# Patient Record
Sex: Female | Born: 1995 | Race: White | Hispanic: No | Marital: Single | Smoking: Never smoker
Health system: Southern US, Community
[De-identification: ages and names within clinical notes are randomized; demographics above are authoritative.]

## PROBLEM LIST (undated history)

## (undated) DIAGNOSIS — F32A Depression, unspecified: Secondary | ICD-10-CM

## (undated) DIAGNOSIS — F329 Major depressive disorder, single episode, unspecified: Secondary | ICD-10-CM

---
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
## 2017-02-23 ENCOUNTER — Encounter: Payer: Self-pay | Admitting: Sports Medicine

## 2017-02-23 MED ORDER — IBUPROFEN-FAMOTIDINE 800-26.6 MG PO TABS: 1.0000 | ORAL_TABLET | Freq: Three times a day (TID) | ORAL | 2 refills | Status: DC | PRN

## 2017-02-23 NOTE — Progress Notes (Signed)
  [facility] Training Room Note Veverly FellsMichael D. Delorise Shinerigby, DO  [facility] [place] [phone]  Catherine Bradshaw - 21 y.o. female MRN [id number]  Date of birth: [birth year]  Visit Date: 02/23/2017  PCP: No primary care provider on file.   Referred by: No ref. provider found  SUBJECTIVE:  CC: Right Anterior Hip Pain HPI: 4 weeks of worsening right anterior hip pain that has been fairly stable over the past 4 weeks.  She has been taking intermittent ibuprofen without significant improvement.  Pain is worse with any type of hip flexion.  She has been performing some gentle exercises.  No known trauma.  ROS:   Otherwise per HPI.  OBJECTIVE:  Right hip is overall well aligned.  She has no significant swelling or deformity.  She has worse pain with Pearlean BrownieFaber testing localizing to the right anterior groin.  Minimal pain with Stinchfield testing.  No pain with logroll or FADIR testing.  No significant pain or crepitation or catching with axial load and circumduction.    IMAGING & PROCEDURES: Three-[facility] reveals overall normal bony anatomy.  She is a small amount of osteitis pubis that is incidental.  She has overall normal hip morphology with possibly a small pincer deformity but this is minimal.  Overall well-maintained joint spaces.  No osseous irregularities.   ASSESSMENT & PLAN:  Visit Diagnoses: 1.  Right hip pain consistent with likely right hip flexor strain versus labral pathology.  Given the presentation we will have her begin with 3 weeks of Duexis.  Prescription and sample provided.  We will plan to have her continue with this and see how she progresses with gentle stretching and continued relative rest.  If any lack of improvement I will plan to see her back after the holiday.  Can consider diagnostic and therapeutic intra-articular injection versus MR arthrogram      Meds: No orders of the  defined types were placed in this encounter.   Orders: No orders of the defined types were placed in this encounter.        Andrena MewsMichael D Kito Cuffe, DO    [facility] Physician

## 2017-03-01 ENCOUNTER — Telehealth: Payer: Self-pay | Admitting: Sports Medicine

## 2017-03-01 MED ORDER — IBUPROFEN-FAMOTIDINE 800-26.6 MG PO TABS: ORAL_TABLET | ORAL | 2 refills | Status: DC

## 2017-03-01 NOTE — Telephone Encounter (Signed)
Patient reported she has not received the prescription sent and at the last athletic training room encounter.  New prescription was sent today to be shipped to her new home address and she is going home over the holidays.

## 2017-04-21 ENCOUNTER — Encounter: Payer: Self-pay | Admitting: Sports Medicine

## 2017-04-21 NOTE — Progress Notes (Signed)
  [employer] Training Room Note Veverly FellsMichael D. Delorise Shinerigby, DO  [facility] [place] [phone]  Earney Malletnna Kimber - 22 y.o. female MRN [id number]  Date of birth: 09-21-1995  Visit Date: 04/21/2017  PCP: No primary care provider on file.   Referred by: No ref. provider found  SUBJECTIVE:  CC: Right hip pain f/u HPI: Persistent right anterior hip pain with snapping that is worse while going from flexed to an extended position.  Painful popping slightly but not all the time.  Pain is worse with significant cavity.  She has not been performing therapeutic exercises on a regular basis. ROS: No significant nighttime disturbances.  Otherwise per HPI.  OBJECTIVE:  Right hip is overall well aligned.  She does have pain while going from a hip flexion to neutral position with slight internal rotation. Reproducible snapping over the anterior groin.  She has a small amount of focal pain with palpation of the anterior musculature but this is minimal.  She has generally tight hip flexors but overall hypermobile lumbar spine.  IMAGING & PROCEDURES: MSK ultrasound performed that shows overall no significant hip effusion and the visible labrum does appear to be intact.  She has a fibrotic and markedly tight iliopsoas tendon over the anterior hip.  I am unable to cause the reproducible crepitation while performing ultrasound but she does have dysfunctional muscle contraction with flexion and extension with dynamic impingement appreciated of the iliopsoas tendon.  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  We will plan to have her come to the clinic to have a iliopsoas tendon fenestration and corticosteroid injection plus intra-articular injection next week.  Discussed the risks and benefits briefly and if any lack of  improvement would consider further diagnostic MR arthrogram of the right hip if any lack of improvement with diagnostic and therapeutic injection as outlined above.       Andrena MewsMichael D Rigby, DO     Sports Medicine Physician

## 2017-04-26 ENCOUNTER — Ambulatory Visit: Payer: Self-pay

## 2017-04-26 ENCOUNTER — Ambulatory Visit (INDEPENDENT_AMBULATORY_CARE_PROVIDER_SITE_OTHER): Payer: Commercial Managed Care - PPO | Admitting: Sports Medicine

## 2017-04-26 ENCOUNTER — Encounter: Payer: Self-pay | Admitting: Sports Medicine

## 2017-04-26 VITALS — BP 114/76 | HR 59 | Ht 74.0 in | Wt 155.4 lb

## 2017-04-26 DIAGNOSIS — M25551 Pain in right hip: Secondary | ICD-10-CM

## 2017-04-26 NOTE — Progress Notes (Signed)
Catherine Bradshaw. [facility] at [facility] [phone]  Narda Fundora - 22 y.o. female MRN [id number]  Date of birth: 1995-12-17  Visit Date: 04/26/2017  PCP: No primary care provider on file.   Referred by: No ref. provider found   Scribe for today's visit: Christoper Fabian, LAT, ATC     SUBJECTIVE:  Catherine Bradshaw is here for Follow-up (R hip pain) .   Her R hip pain symptoms INITIALLY: Began in Nov. 13, 2018  When she was sprinting during a punishment  Described as moderate aching and stabbing, radiating to R ant thigh. 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   ROS Reports night time disturbances.  Intermittent depending on what she did during the day. Denies fevers, chills, or night sweats. Denies unexplained weight loss. Denies personal history of cancer. Denies changes in bowel or bladder habits. Denies recent unreported falls. Denies new or worsening dyspnea or wheezing. Denies headaches or dizziness.  Denies numbness, tingling or weakness  In the extremities.  Denies dizziness or presyncopal episodes Denies lower extremity edema     HISTORY & PERTINENT PRIOR DATA:  Prior History reviewed and updated per electronic medical record.  Significant history, findings, studies and interim changes include:  reports that  has never smoked. she has never used smokeless tobacco. No results for input(s): HGBA1C, LABURIC, CREATINE in the last 8760 hours. No specialty comments available. Problem  Right Hip Pain    OBJECTIVE:  VS:  HT:6\' 2"  (188 cm)   WT:155 lb 6.4 oz (70.5 kg)  BMI:19.94    BP:114/76  HR:(!) 59bpm  TEMP: ( )   RESP:99 %   PHYSICAL EXAM: Constitutional: WDWN, Non-toxic appearing. Psychiatric: Alert & appropriately interactive.  Not depressed or anxious appearing. Respiratory: No increased work of breathing.  Trachea Midline Eyes: Pupils are equal.  EOM intact without nystagmus.  No scleral icterus  NEUROVASCULAR exam: No clubbing or cyanosis appreciated No significant venous stasis changes Capillary Refill: normal, less than 2 seconds   Right hip is overall well aligned.  She has some pain with flexion of the hip but this is minimal.  She has good internal and external rotation of the hip.  She does have a reproducible snapping with internal and external rotation.  No additional findings.   ASSESSMENT & PLAN:   1. Right hip pain    PLAN: Discussed the options for injection and ultimately decided to perform both intratendinous and intra-articular components per procedure note.  She should avoid significant exacerbating activities and I will plan to follow-up with her in 3 weeks in the athletic training room.  No problem-specific Assessment & Plan notes found for this encounter.   ++++++++++++++++++++++++++++++++++++++++++++ Orders & Meds: Orders Placed This Encounter  Procedures  . [place] MSK POCT ULTRASOUND    No orders of the defined types were placed in this encounter.   ++++++++++++++++++++++++++++++++++++++++++++ Follow-up: Return in about 3 weeks (around 05/17/2017) for in the [facility].   Pertinent documentation may be included in additional procedure notes, imaging studies, problem based documentation and patient instructions. Please see these sections of the encounter for additional information regarding this visit. CMA/ATC served as Neurosurgeon during this  visit. History, Physical, and Plan performed by medical provider. Documentation and orders reviewed and attested to.      Andrena MewsMichael D Rigby, DO    [place] Physician

## 2017-04-26 NOTE — Procedures (Signed)
PROCEDURE NOTE:  Ultrasound Guided: Injection: Right iliopsoas intratendinous and right hip intra-articular injection Images were obtained and interpreted by myself, Gaspar BiddingMichael Rigby, DO  Images have been saved and stored to PACS system. Images obtained on: GE S7 Ultrasound machine  ULTRASOUND FINDINGS:  Marked fibrotic change of the iliopsoas tendon as it transverses over the anterior hip.  There is no significant joint effusion and the anterior labrum that is able to be visualized is overall well-appearing without evidence of tearing but this is obviously limited test.  DESCRIPTION OF PROCEDURE:  The patient's clinical condition is marked by substantial pain and/or significant functional disability. Other conservative therapy has not provided relief, is contraindicated, or not appropriate. There is a reasonable likelihood that injection will significantly improve the patient's pain and/or functional impairment.  After discussing the risks, benefits and expected outcomes of the injection and all questions were reviewed and answered, the patient wished to undergo the above named procedure. Verbal consent was obtained.  The ultrasound was used to identify the target structure and adjacent neurovascular structures. The skin was then prepped in sterile fashion and the target structure was injected under direct visualization using sterile technique as below:  PREP: Alcohol, Ethel Chloride,   APPROACH: direct, stopcock technique, 22g 3.5in. INJECTATE: 5 cc 1% lidocaine, 2cc 0.5% marcaine, 2cc 40mg /mL DepoMedrol    The iliopsoas tendinous portion was injected initially under direct guidance in both longitudinal and transverse planes to ensure appropriate placement.  Subsequently the needle was repositioned and intra-articular injection was performed with equal distribution of the corticosteroid component into both the tendinous portion and to the intra-articular portion. ASPIRATE: None   DRESSING:  Band-Aid    Post procedural instructions including recommending icing and warning signs for infection were reviewed.  This procedure was well tolerated and there were no complications.   IMPRESSION: Succesful Ultrasound Guided: Injection

## 2017-04-26 NOTE — Patient Instructions (Addendum)
You had an injection today.  Things to be aware of after injection are listed below: . You may experience no significant improvement or even a slight worsening in your symptoms during the first 24 to 48 hours.  After that we expect your symptoms to improve gradually over the next 2 weeks for the medicine to have its maximal effect.  You should continue to have improvement out to 6 weeks after your injection. . Dr. Mariella Blackwelder recommends icing the site of the injection for 20 minutes  1-2 times the day of your injection . You may shower but no swimming, tub bath or Jacuzzi for 24 hours. . If your bandage falls off this does not need to be replaced.  It is appropriate to remove the bandage after 4 hours. . You may resume light activities as tolerated unless otherwise directed per Dr. Sharyn Brilliant during your visit  POSSIBLE STEROID SIDE EFFECTS:  Side effects from injectable steroids tend to be less than when taken orally however you may experience some of the symptoms listed below.  If experienced these should only last for a short period of time. Change in menstrual flow  Edema (swelling)  Increased appetite Skin flushing (redness)  Skin rash/acne  Thrush (oral) Yeast vaginitis    Increased sweating  Depression Increased blood glucose levels Cramping and leg/calf  Euphoria (feeling happy)  POSSIBLE PROCEDURE SIDE EFFECTS: The side effects of the injection are usually fairly minimal however if you may experience some of the following side effects that are usually self-limited and will is off on their own.  If you are concerned please feel free to call the office with questions:  Increased numbness or tingling  Nausea or vomiting  Swelling or bruising at the injection site   Please call our office if if you experience any of the following symptoms over the next week as these can be signs of infection:   Fever greater than 100.5F  Significant swelling at the injection site  Significant redness or drainage  from the injection site  If after 2 weeks you are continuing to have worsening symptoms please call our office to discuss what the next appropriate actions should be including the potential for a return office visit or other diagnostic testing.   You had an injection today.  Things to be aware of after injection are listed below: . You may experience no significant improvement or even a slight worsening in your symptoms during the first 24 to 48 hours.  After that we expect your symptoms to improve gradually over the next 2 weeks for the medicine to have its maximal effect.  You should continue to have improvement out to 6 weeks after your injection. . Dr. Kalley Nicholl recommends icing the site of the injection for 20 minutes  1-2 times the day of your injection . You may shower but no swimming, tub bath or Jacuzzi for 24 hours. . If your bandage falls off this does not need to be replaced.  It is appropriate to remove the bandage after 4 hours. . You may resume light activities as tolerated unless otherwise directed per Dr. Diarra Kos during your visit  POSSIBLE STEROID SIDE EFFECTS:  Side effects from injectable steroids tend to be less than when taken orally however you may experience some of the symptoms listed below.  If experienced these should only last for a short period of time. Change in menstrual flow  Edema (swelling)  Increased appetite Skin flushing (redness)  Skin rash/acne  Thrush (oral)   Yeast vaginitis    Increased sweating  Depression Increased blood glucose levels Cramping and leg/calf  Euphoria (feeling happy)  POSSIBLE PROCEDURE SIDE EFFECTS: The side effects of the injection are usually fairly minimal however if you may experience some of the following side effects that are usually self-limited and will is off on their own.  If you are concerned please feel free to call the office with questions:  Increased numbness or tingling  Nausea or vomiting  Swelling or bruising at the  injection site   Please call our office if if you experience any of the following symptoms over the next week as these can be signs of infection:   Fever greater than 100.5F  Significant swelling at the injection site  Significant redness or drainage from the injection site  If after 2 weeks you are continuing to have worsening symptoms please call our office to discuss what the next appropriate actions should be including the potential for a return office visit or other diagnostic testing.    

## 2017-05-11 ENCOUNTER — Emergency Department (HOSPITAL_COMMUNITY)
Admission: EM | Admit: 2017-05-11 | Discharge: 2017-05-12 | Disposition: A | Discharge status: Home / Self Care | Payer: Commercial Managed Care - PPO | Attending: Emergency Medicine | Admitting: Emergency Medicine

## 2017-05-11 ENCOUNTER — Other Ambulatory Visit: Payer: Self-pay

## 2017-05-11 ENCOUNTER — Encounter (HOSPITAL_COMMUNITY): Payer: Self-pay | Admitting: Nurse Practitioner

## 2017-05-11 DIAGNOSIS — F330 Major depressive disorder, recurrent, mild: Secondary | ICD-10-CM | POA: Diagnosis present

## 2017-05-11 DIAGNOSIS — Z79899 Other long term (current) drug therapy: Secondary | ICD-10-CM | POA: Insufficient documentation

## 2017-05-11 DIAGNOSIS — R45851 Suicidal ideations: Secondary | ICD-10-CM | POA: Diagnosis not present

## 2017-05-11 DIAGNOSIS — F329 Major depressive disorder, single episode, unspecified: Secondary | ICD-10-CM | POA: Diagnosis present

## 2017-05-11 HISTORY — DX: Major depressive disorder, single episode, unspecified: F32.9

## 2017-05-11 HISTORY — DX: Depression, unspecified: F32.A

## 2017-05-11 LAB — RAPID URINE DRUG SCREEN, HOSP PERFORMED
AMPHETAMINES: NOT DETECTED
BARBITURATES: NOT DETECTED
BENZODIAZEPINES: NOT DETECTED
Cocaine: NOT DETECTED
Opiates: NOT DETECTED
TETRAHYDROCANNABINOL: NOT DETECTED

## 2017-05-11 LAB — CBC WITH DIFFERENTIAL/PLATELET
BASOS ABS: 0 10*3/uL (ref 0.0–0.1)
BASOS PCT: 0 %
EOS ABS: 0.3 10*3/uL (ref 0.0–0.7)
EOS PCT: 4 %
HCT: 37.9 % (ref 36.0–46.0)
Hemoglobin: 12.8 g/dL (ref 12.0–15.0)
Lymphocytes Relative: 35 %
Lymphs Abs: 2.2 10*3/uL (ref 0.7–4.0)
MCH: 30.2 pg (ref 26.0–34.0)
MCHC: 33.8 g/dL (ref 30.0–36.0)
MCV: 89.4 fL (ref 78.0–100.0)
Monocytes Absolute: 0.4 10*3/uL (ref 0.1–1.0)
Monocytes Relative: 7 %
Neutro Abs: 3.3 10*3/uL (ref 1.7–7.7)
Neutrophils Relative %: 54 %
PLATELETS: 189 10*3/uL (ref 150–400)
RBC: 4.24 MIL/uL (ref 3.87–5.11)
RDW: 12.8 % (ref 11.5–15.5)
WBC: 6.1 10*3/uL (ref 4.0–10.5)

## 2017-05-11 LAB — URINALYSIS, ROUTINE W REFLEX MICROSCOPIC
BACTERIA UA: NONE SEEN
BILIRUBIN URINE: NEGATIVE
Glucose, UA: NEGATIVE mg/dL
Ketones, ur: NEGATIVE mg/dL
Nitrite: NEGATIVE
PROTEIN: NEGATIVE mg/dL
SPECIFIC GRAVITY, URINE: 1.026 (ref 1.005–1.030)
pH: 5 (ref 5.0–8.0)

## 2017-05-11 LAB — ETHANOL: Alcohol, Ethyl (B): <10 mg/dL (ref <10)

## 2017-05-11 LAB — COMPREHENSIVE METABOLIC PANEL
ALBUMIN: 3.9 g/dL (ref 3.5–5.0)
ALT: 14 U/L (ref 14–54)
AST: 15 U/L (ref 15–41)
Alkaline Phosphatase: 47 U/L (ref 38–126)
Anion gap: 8 (ref 5–15)
BILIRUBIN TOTAL: 0.5 mg/dL (ref 0.3–1.2)
BUN: 10 mg/dL (ref 6–20)
CO2: 24 mmol/L (ref 22–32)
Calcium: 9 mg/dL (ref 8.9–10.3)
Chloride: 106 mmol/L (ref 101–111)
Creatinine, Ser: 0.87 mg/dL (ref 0.44–1.00)
GFR calc Af Amer: >60 mL/min (ref >60)
GFR calc non Af Amer: >60 mL/min (ref >60)
Glucose, Bld: 102 mg/dL — ABNORMAL HIGH (ref 65–99)
POTASSIUM: 3.9 mmol/L (ref 3.5–5.1)
Sodium: 138 mmol/L (ref 135–145)
TOTAL PROTEIN: 7.2 g/dL (ref 6.5–8.1)

## 2017-05-11 LAB — I-STAT BETA HCG BLOOD, ED (MC, WL, AP ONLY)

## 2017-05-11 LAB — SALICYLATE LEVEL

## 2017-05-11 LAB — ACETAMINOPHEN LEVEL

## 2017-05-11 MED ORDER — IBUPROFEN 200 MG PO TABS: 600.0000 mg | ORAL_TABLET | Freq: Three times a day (TID) | ORAL | Status: DC | PRN

## 2017-05-11 MED ORDER — ONDANSETRON HCL 4 MG PO TABS: 4.0000 mg | ORAL_TABLET | Freq: Three times a day (TID) | ORAL | Status: DC | PRN

## 2017-05-11 MED ORDER — ESCITALOPRAM OXALATE 10 MG PO TABS
10.0000 mg | ORAL_TABLET | Freq: Every day | ORAL | Status: DC
  Administered 2017-05-12: 10 mg via ORAL
  Filled 2017-05-11: qty 1

## 2017-05-11 MED ORDER — NORETHIN ACE-ETH ESTRAD-FE 1.5-30 MG-MCG PO TABS: 1.0000 | ORAL_TABLET | Freq: Every day | ORAL | Status: DC

## 2017-05-11 NOTE — ED Notes (Signed)
Pt ambulatory w/o difficulty to room 34 

## 2017-05-11 NOTE — ED Triage Notes (Signed)
[facility]

## 2017-05-11 NOTE — ED Notes (Signed)
On the phone 

## 2017-05-11 NOTE — ED Notes (Signed)
Report given to [facility] RN 

## 2017-05-11 NOTE — ED Notes (Signed)
Pt A&O x 3, no distress noted, calm & cooperative, Visiting with family at present.  Monitoring for safety, Q 15 min checks in effect.

## 2017-05-11 NOTE — ED Provider Notes (Signed)
[facility]-EMERGENCY DEPT Provider Note   CSN: [id number] Arrival date & time: 05/11/17  1403     History   Chief Complaint No chief complaint on file.   HPI Catherine Bradshaw is a 22 y.o. female.  Pt presents to the ED today with depression and si.  The pt said she's been dealing with depression and last night, she had thoughts of taking all her lexapro to kill herself.  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  She has never tried to actually hurt herself.      Past Medical History:  Diagnosis Date  . Depression     Patient Active Problem List   Diagnosis Date Noted  . Right hip pain 04/26/2017  . Chronic left shoulder pain 10/19/2016    History reviewed. No pertinent surgical history.  OB History    No data available       Home Medications    Prior to Admission medications   Medication Sig Start Date End Date Taking? Authorizing Provider  escitalopram (LEXAPRO) 10 MG tablet Take 10 mg by mouth daily.   Yes [provider]  norethindrone-ethinyl estradiol-iron (MICROGESTIN FE,GILDESS FE,LOESTRIN FE) 1.5-30 MG-MCG tablet Take 1 tablet by mouth daily.   Yes [provider]  Ibuprofen-Famotidine (DUEXIS) 800-26.6 MG TABS 1 tab po tid X 14 days then 1 tab po tid as needed Patient not taking: Reported on 05/11/2017 03/01/17   Andrena Mews, DO    Family History History reviewed. No pertinent family history.  Social History Social History   Tobacco Use  . Smoking status: Never Smoker  . Smokeless tobacco: Never Used  Substance Use Topics  . Alcohol use: No    Frequency: Never  . Drug use: No     Allergies   Patient has no known allergies.   Review of Systems Review of Systems  Psychiatric/Behavioral: Positive for suicidal ideas.  All other systems reviewed and are negative.    Physical Exam Updated Vital Signs BP 119/68 (BP Location: Left Arm)   Pulse 70   Temp 98.2 F (36.8 C) (Oral)   Resp  16   Ht 6\' 2"  (1.88 m)   Wt 70.3 kg (155 lb)   LMP 05/11/2017   SpO2 99%   BMI 19.90 kg/m   Physical Exam  Constitutional: She is oriented to person, place, and time. She appears well-developed and well-nourished.  HENT:  Head: Normocephalic and atraumatic.  Right Ear: External ear normal.  Left Ear: External ear normal.  Nose: Nose normal.  Mouth/Throat: Oropharynx is clear and moist.  Eyes: Conjunctivae and EOM are normal. Pupils are equal, round, and reactive to light.  Neck: Normal range of motion. Neck supple.  Cardiovascular: Normal rate, regular rhythm, normal heart sounds and intact distal pulses.  Pulmonary/Chest: Effort normal and breath sounds normal.  Abdominal: Soft. Bowel sounds are normal.  Musculoskeletal: Normal range of motion.  Neurological: She is alert and oriented to person, place, and time.  Skin: Skin is warm. Capillary refill takes less than 2 seconds.  Psychiatric: She exhibits a depressed mood. She expresses suicidal ideation.  Nursing note and vitals reviewed.    ED Treatments / Results  Labs (all labs ordered are listed, but only abnormal results are displayed) Labs Reviewed  COMPREHENSIVE METABOLIC PANEL - Abnormal; Notable for the following components:      Result Value   Glucose, Bld 102 (*)    All other components within normal limits  URINALYSIS,  ROUTINE W REFLEX MICROSCOPIC - Abnormal; Notable for the following components:   Hgb urine dipstick LARGE (*)    Leukocytes, UA TRACE (*)    Squamous Epithelial / LPF 0-5 (*)    All other components within normal limits  RAPID URINE DRUG SCREEN, HOSP PERFORMED  CBC WITH DIFFERENTIAL/PLATELET  ETHANOL  SALICYLATE LEVEL  ACETAMINOPHEN LEVEL  I-STAT BETA HCG BLOOD, ED ([facility], [facility], [facility] ONLY)    EKG  EKG Interpretation None       Radiology No results found.  Procedures Procedures (including critical care time)  Medications Ordered in ED Medications  ibuprofen (ADVIL,MOTRIN) tablet 600  mg (not administered)  ondansetron (ZOFRAN) tablet 4 mg (not administered)     Initial Impression / Assessment and Plan / ED Course  I have reviewed the triage vital signs and the nursing notes.  Pertinent labs & imaging results that were available during my care of the patient were reviewed by me and considered in my medical decision making (see chart for details).  Labs are ok.  Pt is medically clear.   TTS consult placed.  Disposition per TTS.  Final Clinical Impressions(s) / ED Diagnoses   Final diagnoses:  Suicidal ideation  Major depressive disorder with single episode, remission status unspecified    ED Discharge Orders    None       Jacalyn LefevreHaviland, Philisha Weinel, MD 05/11/17 [phone]
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
## 2017-05-12 ENCOUNTER — Inpatient Hospital Stay (HOSPITAL_COMMUNITY)
Admission: AD | Admit: 2017-05-12 | Payer: Commercial Managed Care - PPO | Source: Intra-hospital | Admitting: Psychiatry

## 2017-05-12 DIAGNOSIS — F330 Major depressive disorder, recurrent, mild: Secondary | ICD-10-CM | POA: Diagnosis present

## 2017-05-12 NOTE — ED Notes (Signed)
Pt discharged to the care of her mother.  Resources were reviewed with patient.  All belongings were returned. Pt contracts for safety.

## 2017-05-12 NOTE — ED Notes (Signed)
As per TTS, pt [facility].

## 2017-05-12 NOTE — BHH Counselor (Signed)
Pt states she does not want to sign the voluntary admissions form because her mother is coming in the morning and taking her "somewhere".  The IVC process was explained to the pt.  Will revisit placement for the pt in the AM.  [facility] and pt RN Joanie CoddingtonLatricia were made aware.

## 2017-05-12 NOTE — BH Assessment (Signed)
[facility]  Per Juanetta BeetsJacqueline Norman, DO, this pt does not require psychiatric hospitalization at this time.  [facility] for both [facility] and counseling.  This has been included in pt's discharge instructions.  Pt's nurse, Kendal Hymendie, has been notified.  Doylene Canninghomas Chalice Philbert, MA Triage Specialist [phone]

## 2017-05-12 NOTE — BHH Counselor (Signed)
[facility] and can arrive ASAP [facility].

## 2017-05-12 NOTE — Discharge Instructions (Signed)
For your behavioral health needs, you are advised to [facility] with [facility].  They offer both psychiatry and counseling:       Counseling Center      [facility] Health Services      [facility] [place]      [facility]      [address]      [place], [place] [address]      [phone]

## 2017-05-12 NOTE — BHH Suicide Risk Assessment (Signed)
Suicide Risk Assessment  Discharge Assessment   [facility]   Principal Problem: Major depressive disorder, recurrent episode, mild (HCC) Discharge Diagnoses:  Patient Active Problem List   Diagnosis Date Noted  . Major depressive disorder, recurrent episode, mild (HCC) [F33.0] 05/12/2017    Priority: High  . Right hip pain [M25.551] 04/26/2017  . Chronic left shoulder pain [M25.512, G89.29] 10/19/2016    Total Time spent with patient: 45 minutes  Musculoskeletal: Strength & Muscle Tone: within normal limits Gait & Station: normal Patient leans: N/A  Psychiatric Specialty Exam:   Blood pressure 127/71, pulse 60, temperature 98 F (36.7 C), temperature source Oral, resp. rate 18, height 6\' 2"  (1.88 m), weight 70.3 kg (155 lb), last menstrual period 05/11/2017, SpO2 100 %.Body mass index is 19.9 kg/m.  General Appearance: Casual  Eye Contact::  Good  Speech:  Normal Rate409  Volume:  Normal  Mood:  Depressed, mild  Affect:  Congruent  Thought Process:  Coherent and Descriptions of Associations: Intact  Orientation:  Full (Time, Place, and Person)  Thought Content:  WDL and Logical  Suicidal Thoughts:  No  Homicidal Thoughts:  No  Memory:  Immediate;   Good Recent;   Good Remote;   Good  Judgement:  Good  Insight:  Good  Psychomotor Activity:  Normal  Concentration:  Good  Recall:  Good  Fund of Knowledge:Good  Language: Good  Akathisia:  No  Handed:  Right  AIMS (if indicated):     Assets:  Leisure Time Physical Health Resilience Social Support  Sleep:     Cognition: WNL  ADL's:  Intact   Mental Status Per Nursing Assessment::   On Admission:    22 yo female who presented to the ED with suicidal ideations a couple of days and wanted to have her medications adjusted.  She has been upset over an injury that has kept her from playing volleyball for her college.  Her [place] from [place] stay with her.  Neither want  her to be admitted and do not have safety concerns.  No past suicide attempts, currently takes Lexapro and sees her counseling center at school.  No suicidal/homicidal ideations, hallucinations, and substance abuse.  Stable for discharge.  Demographic Factors:  Adolescent or young adult and Caucasian  Loss Factors: NA  Historical Factors: NA  Risk Reduction Factors:   Sense of responsibility to family, Living with another person, especially a relative, Positive social support and Positive therapeutic relationship  Continued Clinical Symptoms:  Depression, mild  Cognitive Features That Contribute To Risk:  None    Suicide Risk:  Minimal: No identifiable suicidal ideation.  Patients presenting with no risk factors but with morbid ruminations; may be classified as minimal risk based on the severity of the depressive symptoms    Plan Of Care/Follow-up recommendations:  Activity:  as tolerated Diet:  heart healhty diet  Toniyah Dilmore, NP 05/12/2017, 10:27 AM

## 2017-05-19 ENCOUNTER — Telehealth: Payer: Self-pay | Admitting: Sports Medicine

## 2017-05-19 DIAGNOSIS — M25551 Pain in right hip: Secondary | ICD-10-CM

## 2017-05-23 ENCOUNTER — Other Ambulatory Visit: Payer: Self-pay | Admitting: Physical Therapy

## 2017-05-23 DIAGNOSIS — M25551 Pain in right hip: Principal | ICD-10-CM

## 2017-05-23 DIAGNOSIS — G8929 Other chronic pain: Secondary | ICD-10-CM

## 2017-05-23 NOTE — Telephone Encounter (Signed)
  [facility] Training Room Note Veverly FellsMichael D. Delorise Shinerigby, DO  [facility] [place] [phone]  Earney Malletnna Tetterton - 22 y.o. female MRN [id number]  Date of birth: 1995-05-27  Visit Date: 05/19/17  PCP: Patient, No Pcp Per   Referred by: No ref. provider found  SUBJECTIVE:  CC: Right Hip Pain HPI: Persistent right hip pain clicking and popping with almost every step at this time.  She reports that her pain has improved over the past 1 week she actually had worsening following last injection that was both a peritendinous and intra-articular injection. ROS: Recent hospitalization for psychiatric reasons.  Reporting doing well at this time.  On Lexapro.  Otherwise per HPI.  OBJECTIVE:  Adult female.  No acute distress.  Alert and appropriate.  Insight is moderate. Right hip has reproducible clicking and popping with circumduction and flexion extension of the hip.  This is mildly painful.  No pain with axial load and circumduction that is mild.  IMAGING & PROCEDURES: n/a   ASSESSMENT & PLAN:  Visit Diagnoses:  1. Right hip pain    Given persistent ongoing mechanical symptoms, pain and lack of improvement following conservative measures and intra-articular injection further diagnostic evaluation with MR arthrogram indicated.  This will be ordered and will plan to follow-up with her after this is obtained.  Meds: No orders of the defined types were placed in this encounter.   Orders: No orders of the defined types were placed in this encounter.        Andrena MewsMichael D Kiyo Heal, DO    [facility] Physician

## 2017-06-08 ENCOUNTER — Ambulatory Visit
Admission: RE | Admit: 2017-06-08 | Discharge: 2017-06-08 | Disposition: A | Discharge status: Home / Self Care | Payer: Commercial Managed Care - PPO | Source: Ambulatory Visit | Attending: Sports Medicine | Admitting: Sports Medicine

## 2017-06-08 DIAGNOSIS — M25551 Pain in right hip: Principal | ICD-10-CM

## 2017-06-08 DIAGNOSIS — G8929 Other chronic pain: Secondary | ICD-10-CM

## 2017-06-08 MED ORDER — IOPAMIDOL (ISOVUE-M 200) INJECTION 41%
15.0000 mL | Freq: Once | INTRAMUSCULAR | Status: AC
  Administered 2017-06-08: 15 mL via INTRA_ARTICULAR

## 2017-06-08 NOTE — Progress Notes (Signed)
Positive for labral tear.  [facility] to [facility] Caswell [facility]winStubbs at [facility] for Arthroscopy/repair consult

## 2017-06-09 ENCOUNTER — Other Ambulatory Visit: Payer: Self-pay | Admitting: Physical Therapy

## 2017-06-09 DIAGNOSIS — G8929 Other chronic pain: Secondary | ICD-10-CM

## 2017-06-09 DIAGNOSIS — M25551 Pain in right hip: Principal | ICD-10-CM

## 2018-07-13 IMAGING — MR MR HIP*R* W/CM
4 of 5 series · 21 of 40 positions shown · IV contrast (agent unspecified)
Comparison: None.

CLINICAL DATA: Right hip pain since an injury playing volleyball in
October 2017.

EXAM:
MRI OF THE RIGHT HIP WITH CONTRAST (MR Arthrogram)
TECHNIQUE: Multiplanar, multisequence MR imaging of the hip was performed
immediately following contrast injection into the hip joint under
fluoroscopic guidance. No intravenous contrast was administered.

[Series 3: T1 fat-sat · axial · 4.0mm · 0.47mm/px · z∈[-68,+28]mm · 8 of 21 slices shown (1 of 2)]
[im 1/21]
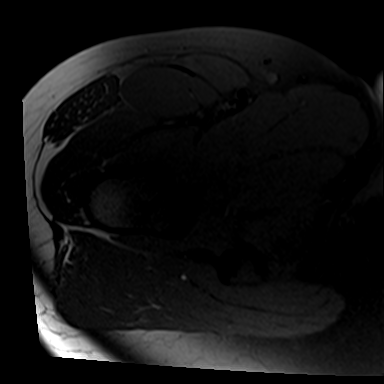
[im 3/21]
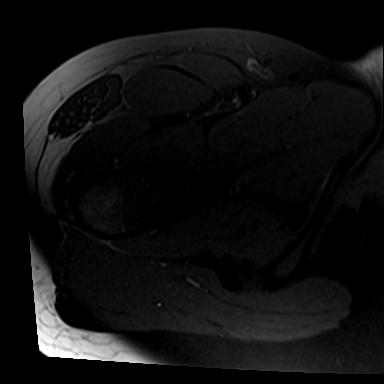
[im 6/21]
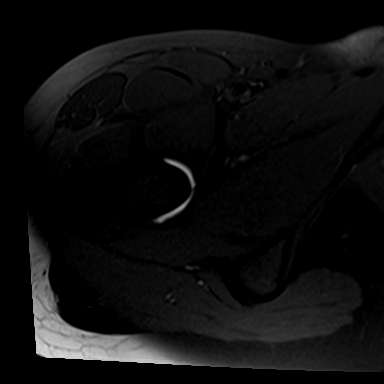
[im 9/21]
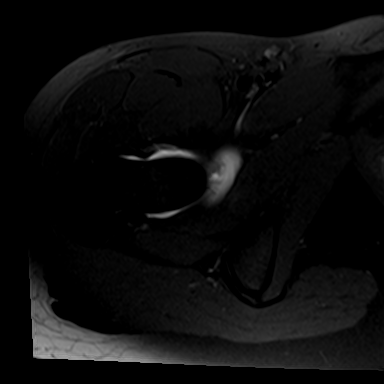
[im 12/21]
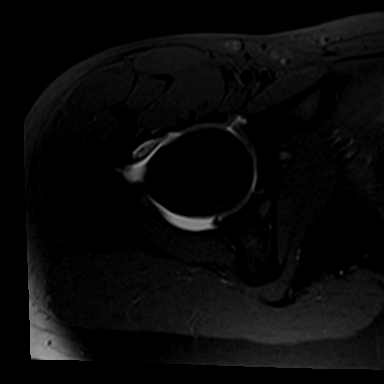
[im 15/21]
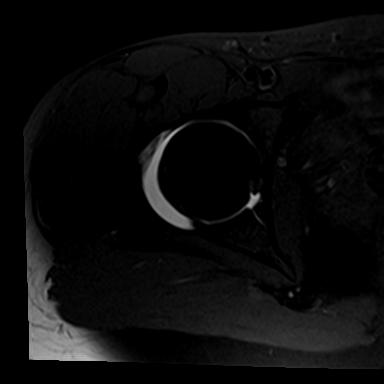
[im 18/21]
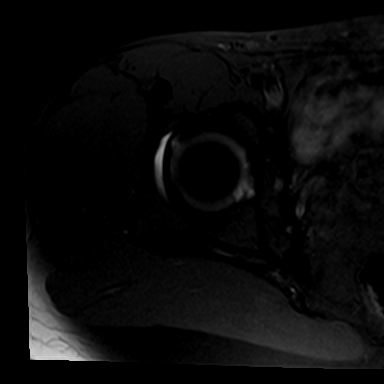
[im 21/21]
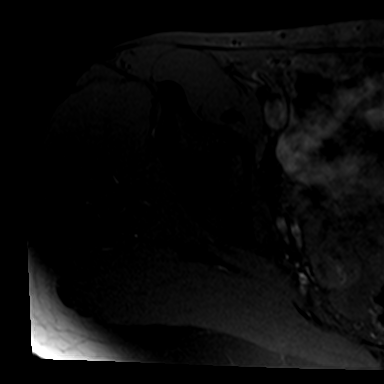

[Series 4: T1 fat-sat · coronal · 4.0mm · 0.47mm/px · 7 of 20 slices shown (2 of 2)]
[im 1/20]
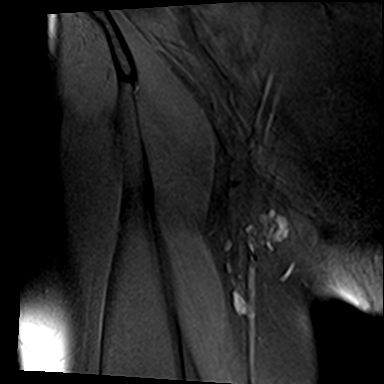
[im 4/20]
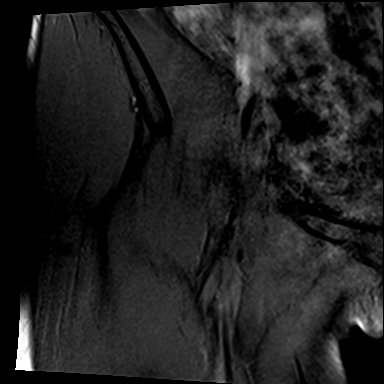
[im 7/20]
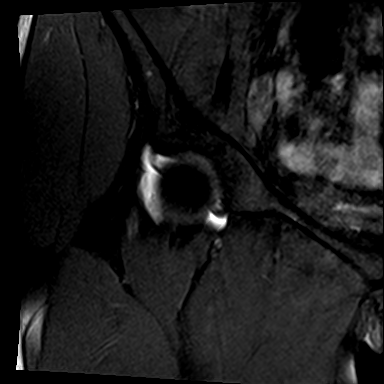
[im 10/20]
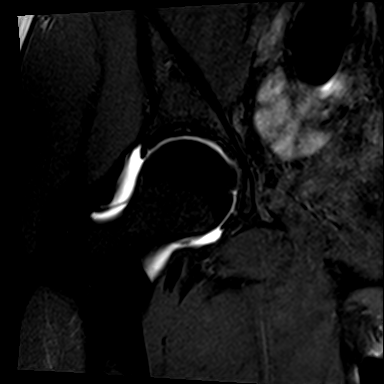
[im 13/20]
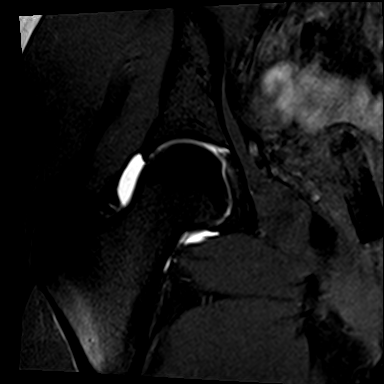
[im 16/20]
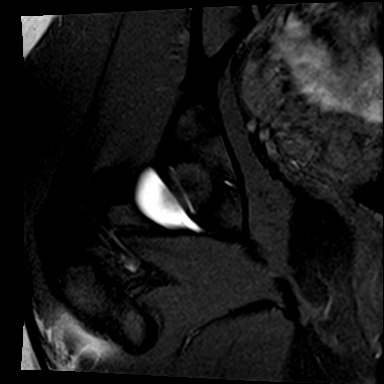
[im 20/20]
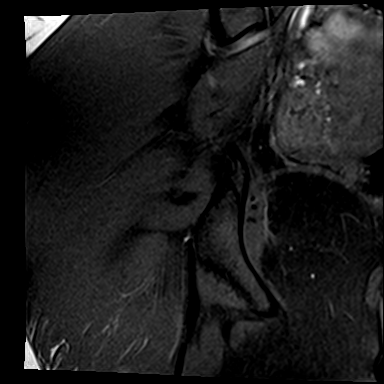

[Series 5: T1 · coronal · 5.0mm · 0.52mm/px · 3 of 19 slices shown]
[im 4/19]
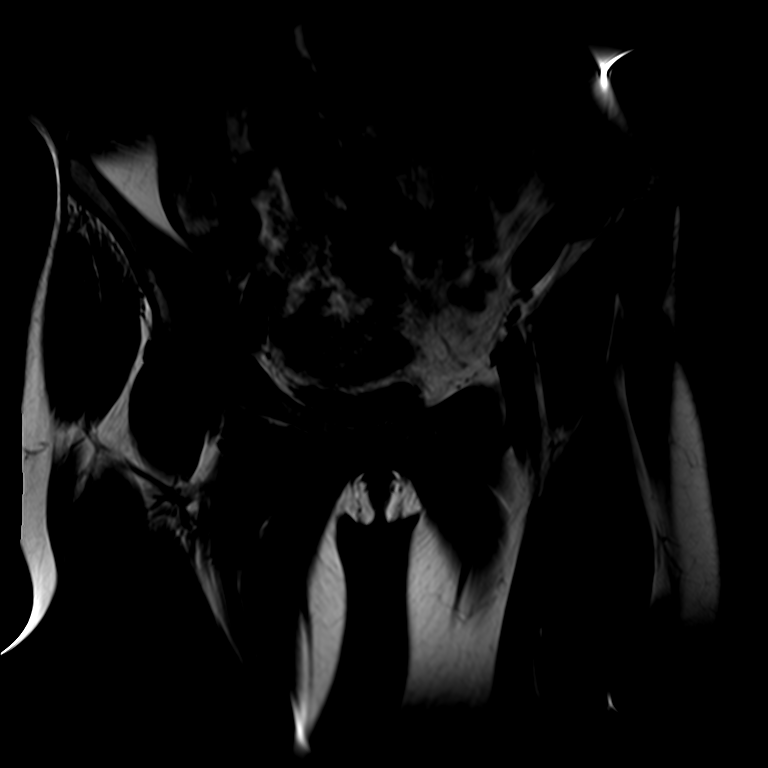
[im 10/19]
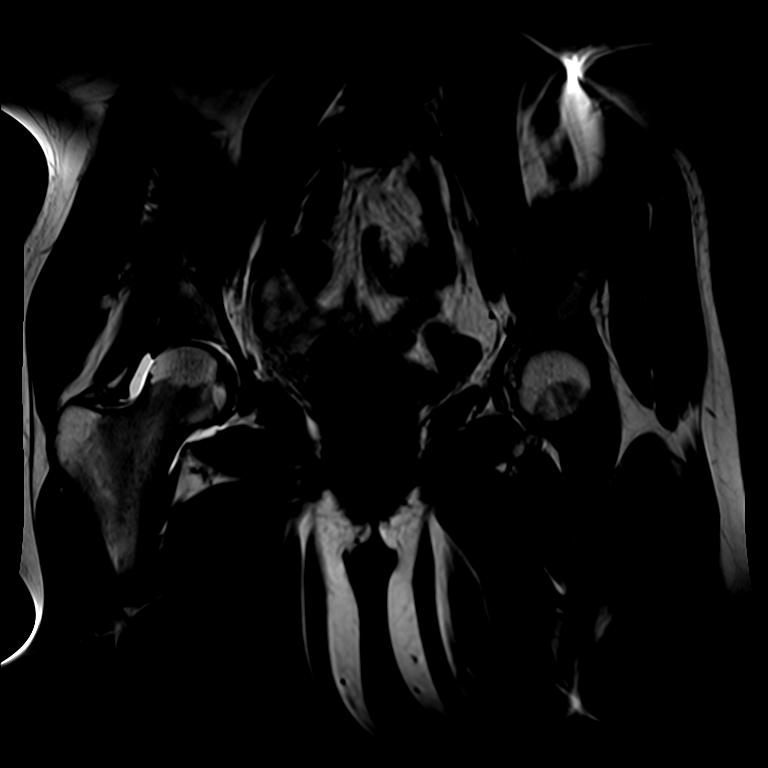
[im 16/19]
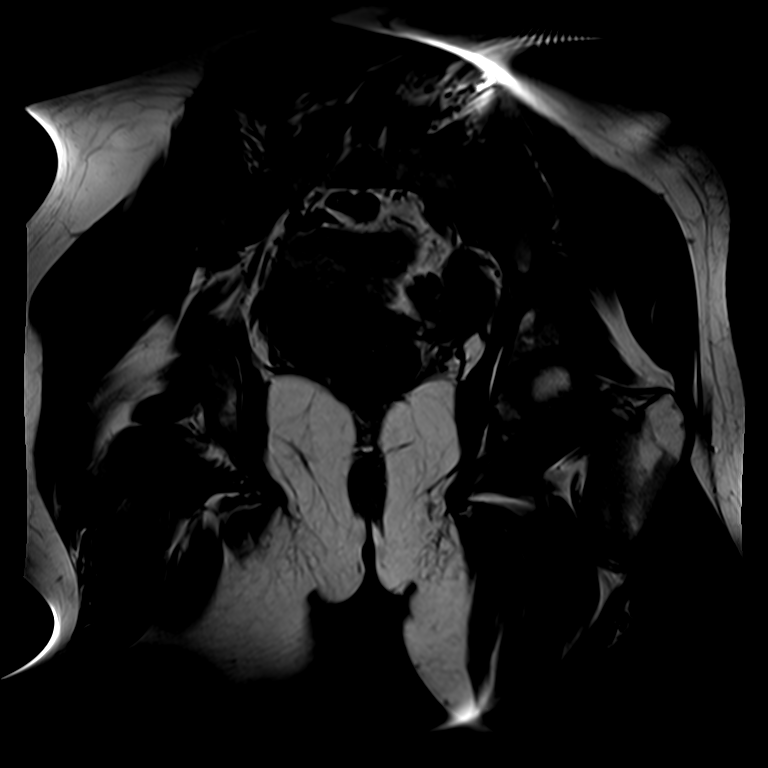

[Series 6: T2 fat-sat · coronal · 4.0mm · 0.62mm/px · 3 of 22 slices shown]
[im 4/22]
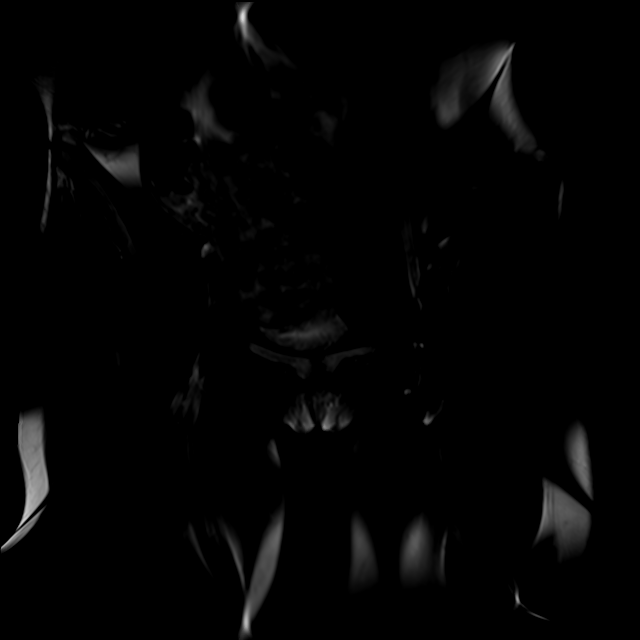
[im 13/22]
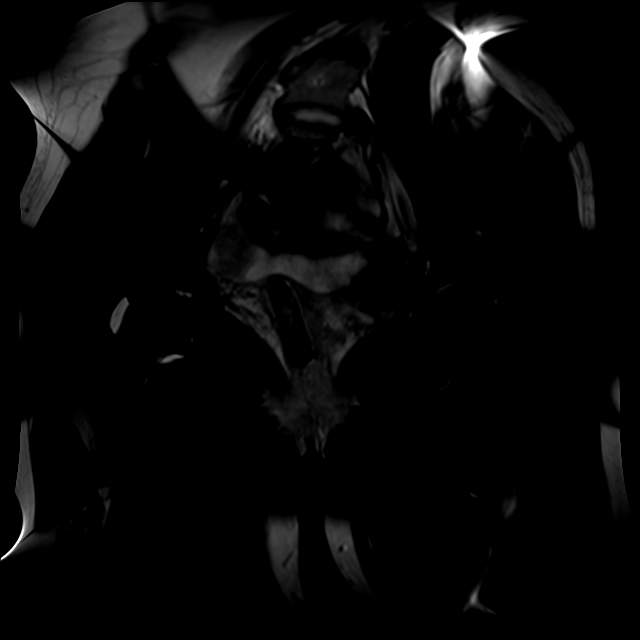
[im 19/22]
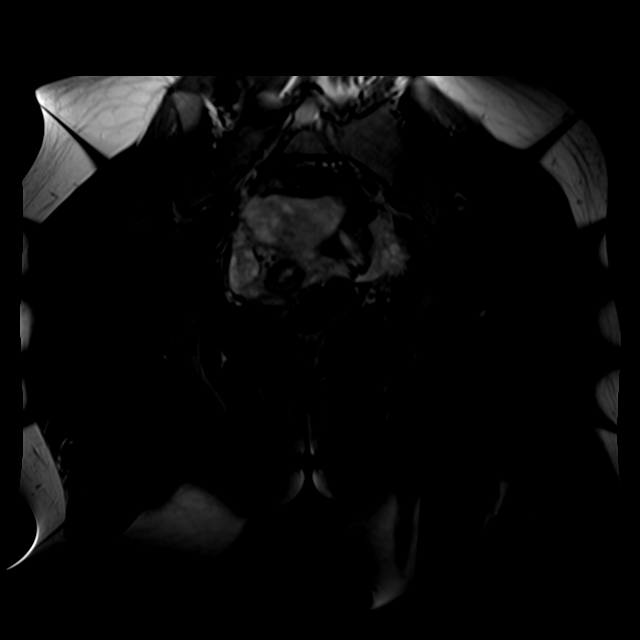

[21 of 40 positions shown; findings below may reference images not displayed]

FINDINGS: Bones: Bone marrow signal is normal throughout without fracture,
stress change or focal lesion. No avascular necrosis of the femoral
heads.

Articular cartilage and labrum

Articular cartilage:  Preserved.

Labrum: Contrast extends into the substance of the anterior,
superior labrum consistent with a labral tear as best seen on image
13 of series 4 and images 18-20 of series 7.

Joint or bursal effusion

Joint effusion: The right hip is distended with contrast. No left
hip effusion.

Bursae: Normal.

Muscles and tendons

Muscles and tendons:  Normal.

Other findings

Miscellaneous:   Imaged intrapelvic contents appear normal.
IMPRESSION: The examination is positive for anterior, superior right labral
tear. No other abnormality is identified.
